# Patient Record
Sex: Female | Born: 1980 | Race: Black or African American | Hispanic: No | Smoking: Never smoker
Health system: Southern US, Community
[De-identification: ages and names within clinical notes are randomized; demographics above are authoritative.]

---

## 2018-01-10 DIAGNOSIS — N970 Female infertility associated with anovulation: Secondary | ICD-10-CM | POA: Diagnosis not present

## 2018-01-31 DIAGNOSIS — Z118 Encounter for screening for other infectious and parasitic diseases: Secondary | ICD-10-CM | POA: Diagnosis not present

## 2018-01-31 DIAGNOSIS — Z114 Encounter for screening for human immunodeficiency virus [HIV]: Secondary | ICD-10-CM | POA: Diagnosis not present

## 2018-01-31 DIAGNOSIS — Z131 Encounter for screening for diabetes mellitus: Secondary | ICD-10-CM | POA: Diagnosis not present

## 2018-01-31 DIAGNOSIS — Z0000 Encounter for general adult medical examination without abnormal findings: Secondary | ICD-10-CM | POA: Diagnosis not present

## 2018-01-31 DIAGNOSIS — Z01419 Encounter for gynecological examination (general) (routine) without abnormal findings: Secondary | ICD-10-CM | POA: Diagnosis not present

## 2018-01-31 DIAGNOSIS — Z1329 Encounter for screening for other suspected endocrine disorder: Secondary | ICD-10-CM | POA: Diagnosis not present

## 2018-01-31 DIAGNOSIS — Z113 Encounter for screening for infections with a predominantly sexual mode of transmission: Secondary | ICD-10-CM | POA: Diagnosis not present

## 2018-01-31 DIAGNOSIS — Z1159 Encounter for screening for other viral diseases: Secondary | ICD-10-CM | POA: Diagnosis not present

## 2018-01-31 DIAGNOSIS — Z130 Encounter for screening for diseases of the blood and blood-forming organs and certain disorders involving the immune mechanism: Secondary | ICD-10-CM | POA: Diagnosis not present

## 2018-01-31 DIAGNOSIS — Z1231 Encounter for screening mammogram for malignant neoplasm of breast: Secondary | ICD-10-CM | POA: Diagnosis not present

## 2018-01-31 DIAGNOSIS — Z6832 Body mass index (BMI) 32.0-32.9, adult: Secondary | ICD-10-CM | POA: Diagnosis not present

## 2018-02-08 DIAGNOSIS — Z13220 Encounter for screening for lipoid disorders: Secondary | ICD-10-CM | POA: Diagnosis not present

## 2018-02-08 DIAGNOSIS — N970 Female infertility associated with anovulation: Secondary | ICD-10-CM | POA: Diagnosis not present

## 2018-03-29 ENCOUNTER — Ambulatory Visit: Payer: Self-pay | Admitting: Registered"

## 2018-08-15 ENCOUNTER — Other Ambulatory Visit: Payer: Self-pay

## 2018-08-15 ENCOUNTER — Encounter: Payer: Self-pay | Admitting: Emergency Medicine

## 2018-08-15 ENCOUNTER — Emergency Department
Admission: EM | Admit: 2018-08-15 | Discharge: 2018-08-15 | Disposition: A | Discharge status: Home / Self Care | Payer: Self-pay | Attending: Student in an Organized Health Care Education/Training Program | Admitting: Student in an Organized Health Care Education/Training Program

## 2018-08-15 DIAGNOSIS — R438 Other disturbances of smell and taste: Secondary | ICD-10-CM | POA: Insufficient documentation

## 2018-08-15 DIAGNOSIS — R2233 Localized swelling, mass and lump, upper limb, bilateral: Secondary | ICD-10-CM | POA: Insufficient documentation

## 2018-08-15 DIAGNOSIS — R110 Nausea: Secondary | ICD-10-CM | POA: Insufficient documentation

## 2018-08-15 LAB — CBC
HCT: 42.7 % (ref 36.0–46.0)
Hemoglobin: 13.8 g/dL (ref 12.0–15.0)
MCH: 27.5 pg (ref 26.0–34.0)
MCHC: 32.3 g/dL (ref 30.0–36.0)
MCV: 85.2 fL (ref 80.0–100.0)
Platelets: 377 10*3/uL (ref 150–400)
RBC: 5.01 MIL/uL (ref 3.87–5.11)
RDW: 12.8 % (ref 11.5–15.5)
WBC: 9.1 10*3/uL (ref 4.0–10.5)
nRBC: 0 % (ref 0.0–0.2)

## 2018-08-15 LAB — COMPREHENSIVE METABOLIC PANEL
ALT: 30 U/L (ref 0–44)
AST: 26 U/L (ref 15–41)
Albumin: 4.8 g/dL (ref 3.5–5.0)
Alkaline Phosphatase: 71 U/L (ref 38–126)
Anion gap: 11 (ref 5–15)
BUN: 7 mg/dL (ref 6–20)
CO2: 27 mmol/L (ref 22–32)
Calcium: 10.4 mg/dL — ABNORMAL HIGH (ref 8.9–10.3)
Chloride: 101 mmol/L (ref 98–111)
Creatinine, Ser: 0.79 mg/dL (ref 0.44–1.00)
GFR calc Af Amer: >60 mL/min (ref >60)
GFR calc non Af Amer: >60 mL/min (ref >60)
Glucose, Bld: 118 mg/dL — ABNORMAL HIGH (ref 70–99)
Potassium: 3.8 mmol/L (ref 3.5–5.1)
Sodium: 139 mmol/L (ref 135–145)
Total Bilirubin: 0.6 mg/dL (ref 0.3–1.2)
Total Protein: 8.4 g/dL — ABNORMAL HIGH (ref 6.5–8.1)

## 2018-08-15 LAB — URINALYSIS, COMPLETE (UACMP) WITH MICROSCOPIC
Bacteria, UA: NONE SEEN
Bilirubin Urine: NEGATIVE
Glucose, UA: NEGATIVE mg/dL
Hgb urine dipstick: NEGATIVE
Ketones, ur: NEGATIVE mg/dL
Leukocytes,Ua: NEGATIVE
Nitrite: NEGATIVE
Protein, ur: NEGATIVE mg/dL
Specific Gravity, Urine: 1.008 (ref 1.005–1.030)
pH: 5 (ref 5.0–8.0)

## 2018-08-15 LAB — LIPASE, BLOOD: Lipase: 27 U/L (ref 11–51)

## 2018-08-15 LAB — POCT PREGNANCY, URINE: Preg Test, Ur: NEGATIVE

## 2018-08-15 MED ORDER — SODIUM CHLORIDE 0.9% FLUSH: 3.0000 mL | Freq: Once | INTRAVENOUS | Status: DC

## 2018-08-15 MED ORDER — PROMETHAZINE HCL 25 MG PO TABS
12.5000 mg | ORAL_TABLET | Freq: Once | ORAL | Status: AC
  Administered 2018-08-15: 12.5 mg via ORAL
  Filled 2018-08-15: qty 1

## 2018-08-15 MED ORDER — PROMETHAZINE HCL 12.5 MG PO TABS: 12.5000 mg | ORAL_TABLET | Freq: Four times a day (QID) | ORAL | 0 refills | Status: AC | PRN

## 2018-08-15 MED ORDER — ONDANSETRON HCL 4 MG PO TABS
4.0000 mg | ORAL_TABLET | Freq: Once | ORAL | Status: AC
  Administered 2018-08-15: 4 mg via ORAL
  Filled 2018-08-15: qty 1

## 2018-08-15 MED ORDER — FAMOTIDINE 20 MG PO TABS: 20.0000 mg | ORAL_TABLET | Freq: Two times a day (BID) | ORAL | 1 refills | Status: AC

## 2018-08-15 NOTE — ED Provider Notes (Signed)
[facility] Emergency Department Provider Note    First MD Initiated Contact with Patient 08/15/18 2157     (approximate)  I have reviewed the triage vital signs and the nursing notes.   HISTORY  Chief Complaint Nausea    HPI Monica Montgomery is a 38 y.o. female presents the ER for several days to weeks of nausea.  Denies any pain.  Denies any diarrhea.  No flank discomfort.  She is also aware that she is had some swelling in her hands and a metallic taste in her mouth.  Does have a history of reflux.  No chest pain or shortness of breath.  No sick contacts.    History reviewed. No pertinent past medical history. No family history on file. History reviewed. No pertinent surgical history. There are no active problems to display for this patient.     Prior to Admission medications   Medication Sig Start Date End Date Taking? Authorizing Provider  famotidine (PEPCID) 20 MG tablet Take 1 tablet (20 mg total) by mouth 2 (two) times daily. 08/15/18 08/15/19  Willy Eddyobinson, Samreen Seltzer, MD  promethazine (PHENERGAN) 12.5 MG tablet Take 1 tablet (12.5 mg total) by mouth every 6 (six) hours as needed for nausea or vomiting. 08/15/18   Willy Eddyobinson, Sevastian Witczak, MD    Allergies Patient has no known allergies.    Social History Social History   Tobacco Use   Smoking status: Never Smoker   Smokeless tobacco: Never Used  Substance Use Topics   Alcohol use: Not Currently    Frequency: Never   Drug use: Never    Review of Systems Patient denies headaches, rhinorrhea, blurry vision, numbness, shortness of breath, chest pain, edema, cough, abdominal pain, nausea, vomiting, diarrhea, dysuria, fevers, rashes or hallucinations unless otherwise stated above in HPI. ____________________________________________   PHYSICAL EXAM:  VITAL SIGNS: Vitals:   08/15/18 1818 08/15/18 2205  BP: 126/81 (!) 149/78  Pulse: 94 80  Resp: 18   Temp: 98.4 F (36.9 C)   SpO2: 100% 98%     Constitutional: Alert and oriented.  Eyes: Conjunctivae are normal.  Head: Atraumatic. Nose: No congestion/rhinnorhea. Mouth/Throat: Mucous membranes are moist.   Neck: No stridor. Painless ROM.  Cardiovascular: Normal rate, regular rhythm. Grossly normal heart sounds.  Good peripheral circulation. Respiratory: Normal respiratory effort.  No retractions. Lungs CTAB. Gastrointestinal: Soft and nontender. No distention. No abdominal bruits. No CVA tenderness. Genitourinary:  Musculoskeletal: No lower extremity tenderness nor edema.  No joint effusions. Neurologic:  Normal speech and language. No gross focal neurologic deficits are appreciated. No facial droop Skin:  Skin is warm, dry and intact. No rash noted. Psychiatric: Mood and affect are normal. Speech and behavior are normal.  ____________________________________________   LABS (all labs ordered are listed, but only abnormal results are displayed)  Results for orders placed or performed during the hospital encounter of 08/15/18 (from the past 24 hour(s))  Urinalysis, Complete w Microscopic     Status: Abnormal   Collection Time: 08/15/18  6:21 PM  Result Value Ref Range   Color, Urine STRAW (A) YELLOW   APPearance HAZY (A) CLEAR   Specific Gravity, Urine 1.008 1.005 - 1.030   pH 5.0 5.0 - 8.0   Glucose, UA NEGATIVE NEGATIVE mg/dL   Hgb urine dipstick NEGATIVE NEGATIVE   Bilirubin Urine NEGATIVE NEGATIVE   Ketones, ur NEGATIVE NEGATIVE mg/dL   Protein, ur NEGATIVE NEGATIVE mg/dL   Nitrite NEGATIVE NEGATIVE   Leukocytes,Ua NEGATIVE NEGATIVE   RBC /  HPF 0-5 0 - 5 RBC/hpf   WBC, UA 0-5 0 - 5 WBC/hpf   Bacteria, UA NONE SEEN NONE SEEN   Squamous Epithelial / LPF 6-10 0 - 5  Lipase, blood     Status: None   Collection Time: 08/15/18  6:22 PM  Result Value Ref Range   Lipase 27 11 - 51 U/L  Comprehensive metabolic panel     Status: Abnormal   Collection Time: 08/15/18  6:22 PM  Result Value Ref Range   Sodium 139 135  - 145 mmol/L   Potassium 3.8 3.5 - 5.1 mmol/L   Chloride 101 98 - 111 mmol/L   CO2 27 22 - 32 mmol/L   Glucose, Bld 118 (H) 70 - 99 mg/dL   BUN 7 6 - 20 mg/dL   Creatinine, Ser 0.79 0.44 - 1.00 mg/dL   Calcium 10.4 (H) 8.9 - 10.3 mg/dL   Total Protein 8.4 (H) 6.5 - 8.1 g/dL   Albumin 4.8 3.5 - 5.0 g/dL   AST 26 15 - 41 U/L   ALT 30 0 - 44 U/L   Alkaline Phosphatase 71 38 - 126 U/L   Total Bilirubin 0.6 0.3 - 1.2 mg/dL   GFR calc non Af Amer >60 >60 mL/min   GFR calc Af Amer >60 >60 mL/min   Anion gap 11 5 - 15  CBC     Status: None   Collection Time: 08/15/18  6:22 PM  Result Value Ref Range   WBC 9.1 4.0 - 10.5 K/uL   RBC 5.01 3.87 - 5.11 MIL/uL   Hemoglobin 13.8 12.0 - 15.0 g/dL   HCT 42.7 36.0 - 46.0 %   MCV 85.2 80.0 - 100.0 fL   MCH 27.5 26.0 - 34.0 pg   MCHC 32.3 30.0 - 36.0 g/dL   RDW 12.8 11.5 - 15.5 %   Platelets 377 150 - 400 K/uL   nRBC 0.0 0.0 - 0.2 %  Pregnancy, urine POC     Status: None   Collection Time: 08/15/18  6:24 PM  Result Value Ref Range   Preg Test, Ur NEGATIVE NEGATIVE   ____________________________________________ ____________________________________________  RADIOLOGY  ____________________________________________   PROCEDURES  Procedure(s) performed:  Procedures    Critical Care performed: no ____________________________________________   INITIAL IMPRESSION / ASSESSMENT AND PLAN / ED COURSE  Pertinent labs & imaging results that were available during my care of the patient were reviewed by me and considered in my medical decision making (see chart for details).   DDX: Gastritis, enteritis, electrolyte abnormality, cholelithiasis, pancreatitis, cholecystitis, esophagitis  Monica Montgomery is a 38 y.o. who presents to the ED with symptoms as described above.  She is afebrile hemodynamically stable.  Her abdominal exam is soft and benign.  Do not see any objective evidence of hand swelling or edema.  Urinalysis without any evidence of  nephritis or nephrotic syndrome.  Primary complaint is nausea.  Will give antiemetic and referral.  Discussed signs and symptoms for which the patient should return to the ER.     The patient was evaluated in Emergency Department today for the symptoms described in the history of present illness. He/she was evaluated in the context of the global COVID-19 pandemic, which necessitated consideration that the patient might be at risk for infection with the SARS-CoV-2 virus that causes COVID-19. Institutional protocols and algorithms that pertain to the evaluation of patients at risk for COVID-19 are in a state of rapid change based on information released by regulatory  bodies including the CDC and federal and state organizations. These policies and algorithms were followed during the patient's care in the ED.  As part of my medical decision making, I reviewed the following data within the electronic MEDICAL RECORD NUMBER Nursing notes reviewed and incorporated, Labs reviewed, notes from prior ED visits and [place] Controlled Substance Database   ____________________________________________   FINAL CLINICAL IMPRESSION(S) / ED DIAGNOSES  Final diagnoses:  Nausea      NEW MEDICATIONS STARTED DURING THIS VISIT:  Discharge Medication List as of 08/15/2018 10:32 PM    START taking these medications   Details  famotidine (PEPCID) 20 MG tablet Take 1 tablet (20 mg total) by mouth 2 (two) times daily., Starting Mon 08/15/2018, Until Tue 08/15/2019, Normal    promethazine (PHENERGAN) 12.5 MG tablet Take 1 tablet (12.5 mg total) by mouth every 6 (six) hours as needed for nausea or vomiting., Starting Mon 08/15/2018, Normal         Note:  This document was prepared using Dragon voice recognition software and may include unintentional dictation errors.    Willy Eddyobinson, Samiha Denapoli, MD 08/15/18 [phone]

## 2018-08-15 NOTE — ED Triage Notes (Signed)
PT c/o nausea xfew days as well as tingling in feet and hands. PT in NAD, denies fever, n/v/d. VSS

## 2018-08-24 ENCOUNTER — Telehealth: Payer: Self-pay

## 2018-08-24 NOTE — Telephone Encounter (Signed)
Pt. Reports she had exposure to COVID 19 at work. Was sent home and told to be tested by her employer. Does not have insurance or a PCP. Will call [facility] Dept. For testing.

## 2018-09-30 ENCOUNTER — Other Ambulatory Visit: Payer: Self-pay | Admitting: Internal Medicine

## 2018-09-30 DIAGNOSIS — Z8639 Personal history of other endocrine, nutritional and metabolic disease: Secondary | ICD-10-CM

## 2018-10-07 ENCOUNTER — Other Ambulatory Visit: Payer: Self-pay

## 2018-10-14 ENCOUNTER — Other Ambulatory Visit: Payer: Self-pay

## 2019-01-31 ENCOUNTER — Ambulatory Visit (HOSPITAL_COMMUNITY)
Admission: EM | Admit: 2019-01-31 | Discharge: 2019-01-31 | Disposition: A | Discharge status: Home / Self Care | Payer: Managed Care, Other (non HMO)

## 2019-01-31 NOTE — ED Notes (Signed)
No answer from patient for registration, assumed LWBS. 

## 2019-02-05 ENCOUNTER — Other Ambulatory Visit: Payer: Self-pay

## 2019-02-05 DIAGNOSIS — R0789 Other chest pain: Secondary | ICD-10-CM | POA: Insufficient documentation

## 2019-02-05 DIAGNOSIS — R05 Cough: Secondary | ICD-10-CM | POA: Insufficient documentation

## 2019-02-05 DIAGNOSIS — Z5321 Procedure and treatment not carried out due to patient leaving prior to being seen by health care provider: Secondary | ICD-10-CM | POA: Diagnosis not present

## 2019-02-06 ENCOUNTER — Emergency Department (HOSPITAL_COMMUNITY): Payer: Managed Care, Other (non HMO)

## 2019-02-06 ENCOUNTER — Emergency Department (HOSPITAL_COMMUNITY)
Admission: EM | Admit: 2019-02-06 | Discharge: 2019-02-06 | Disposition: A | Discharge status: Home / Self Care | Payer: Managed Care, Other (non HMO) | Attending: Emergency Medicine | Admitting: Emergency Medicine

## 2019-02-06 ENCOUNTER — Encounter (HOSPITAL_COMMUNITY): Payer: Self-pay

## 2019-02-06 LAB — BASIC METABOLIC PANEL
Anion gap: 13 (ref 5–15)
BUN: 6 mg/dL (ref 6–20)
CO2: 26 mmol/L (ref 22–32)
Calcium: 10.1 mg/dL (ref 8.9–10.3)
Chloride: 99 mmol/L (ref 98–111)
Creatinine, Ser: 0.82 mg/dL (ref 0.44–1.00)
GFR calc Af Amer: >60 mL/min (ref >60)
GFR calc non Af Amer: >60 mL/min (ref >60)
Glucose, Bld: 124 mg/dL — ABNORMAL HIGH (ref 70–99)
Potassium: 3.9 mmol/L (ref 3.5–5.1)
Sodium: 138 mmol/L (ref 135–145)

## 2019-02-06 LAB — CBC
HCT: 43.7 % (ref 36.0–46.0)
Hemoglobin: 14.2 g/dL (ref 12.0–15.0)
MCH: 28.3 pg (ref 26.0–34.0)
MCHC: 32.5 g/dL (ref 30.0–36.0)
MCV: 87.2 fL (ref 80.0–100.0)
Platelets: 387 10*3/uL (ref 150–400)
RBC: 5.01 MIL/uL (ref 3.87–5.11)
RDW: 12.8 % (ref 11.5–15.5)
WBC: 9.7 10*3/uL (ref 4.0–10.5)
nRBC: 0 % (ref 0.0–0.2)

## 2019-02-06 LAB — TROPONIN I (HIGH SENSITIVITY): Troponin I (High Sensitivity): <2 ng/L (ref <18)

## 2019-02-06 NOTE — ED Notes (Addendum)
Pt left without being seen.called pt.x3

## 2019-02-06 NOTE — ED Triage Notes (Signed)
Pt presents with c/o CP and cough x1 week. Pt states she tested negative for COVID on Thursday. Endorses productive cough and back pain when coughing.

## 2020-05-13 IMAGING — CR DG CHEST 2V
2 series · 2 of 2 positions shown · non-contrast
Comparison: None.

CLINICAL DATA: 38-year-old female with chest pain and cough for 1
week. Recently tested negative for TEDVF-DA.

EXAM:
CHEST - 2 VIEW

[chest pa]
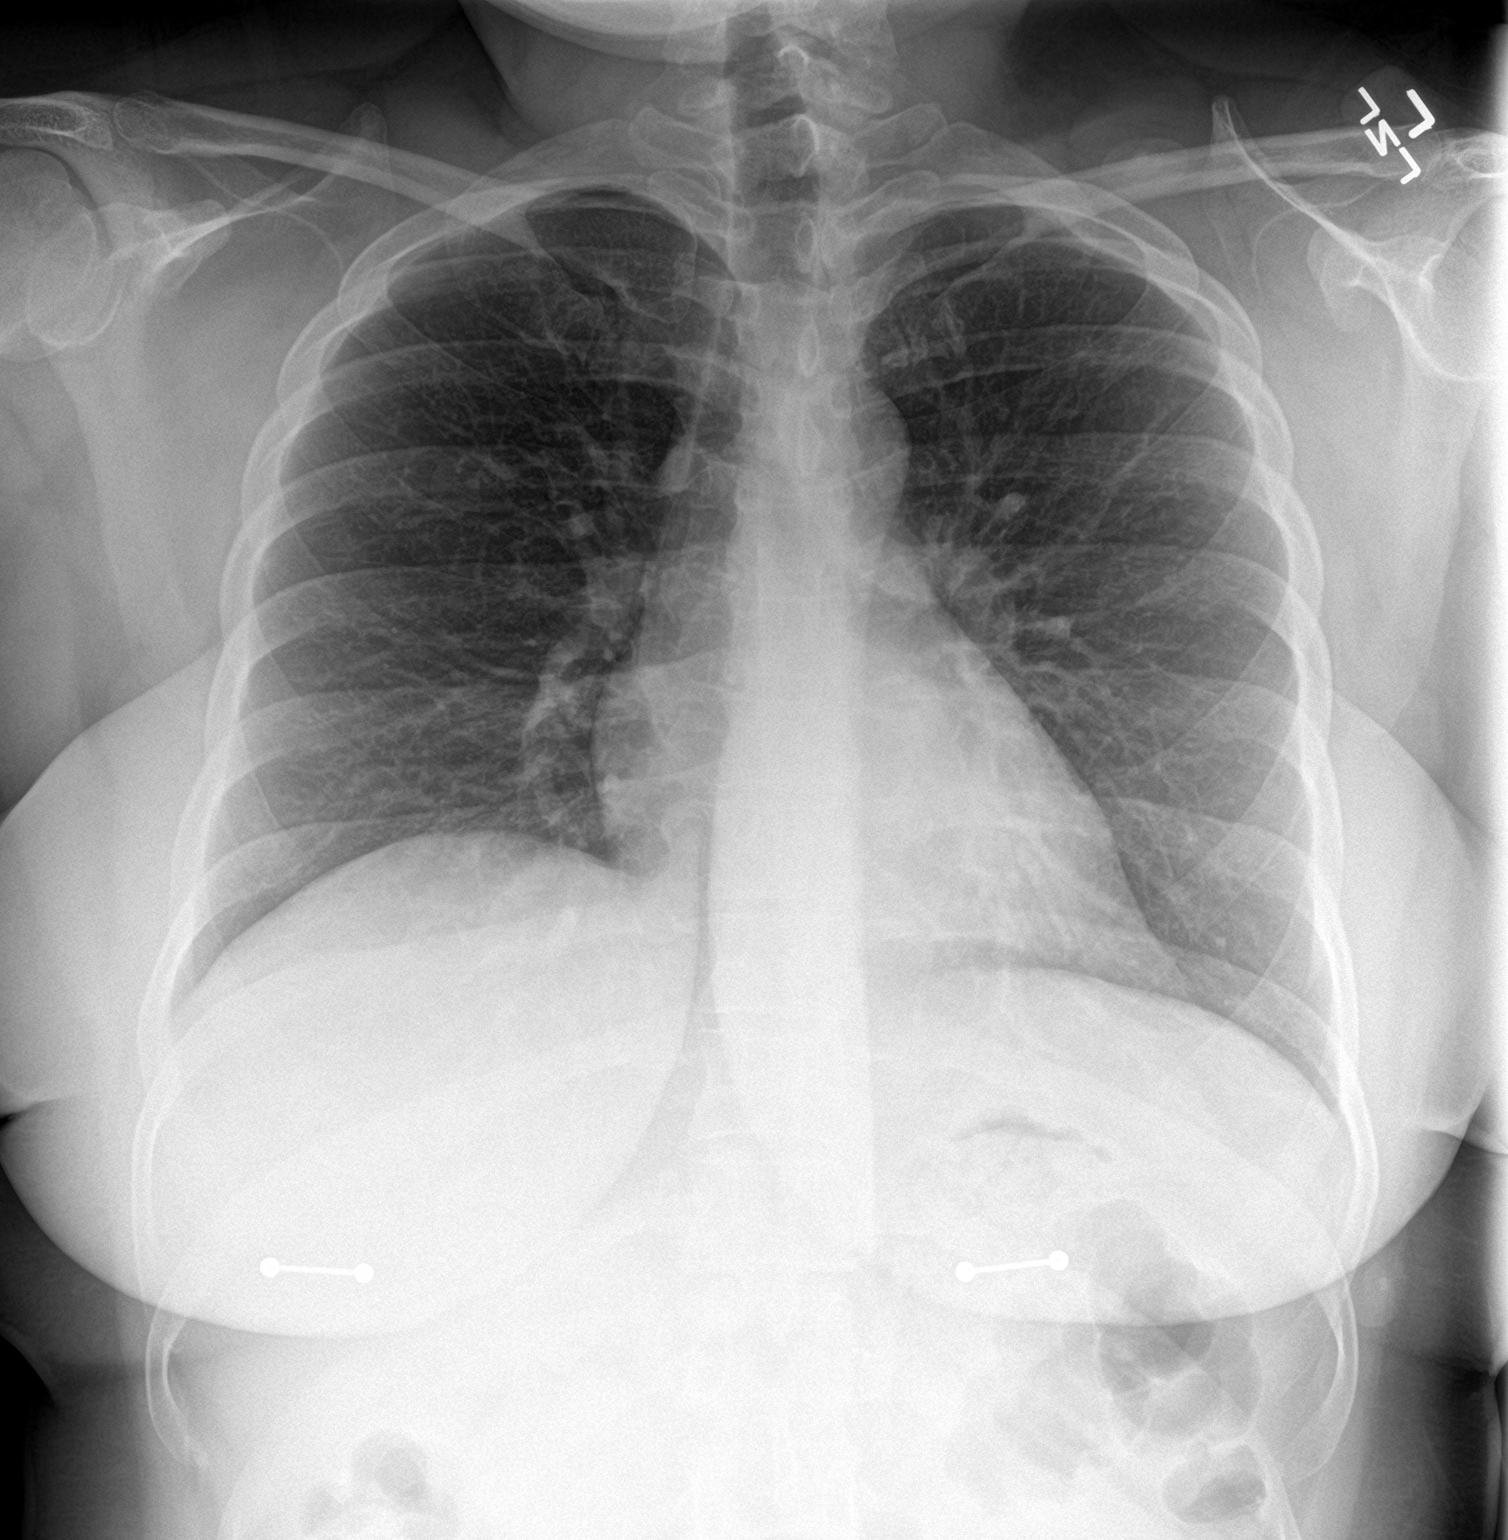

[chest lat]
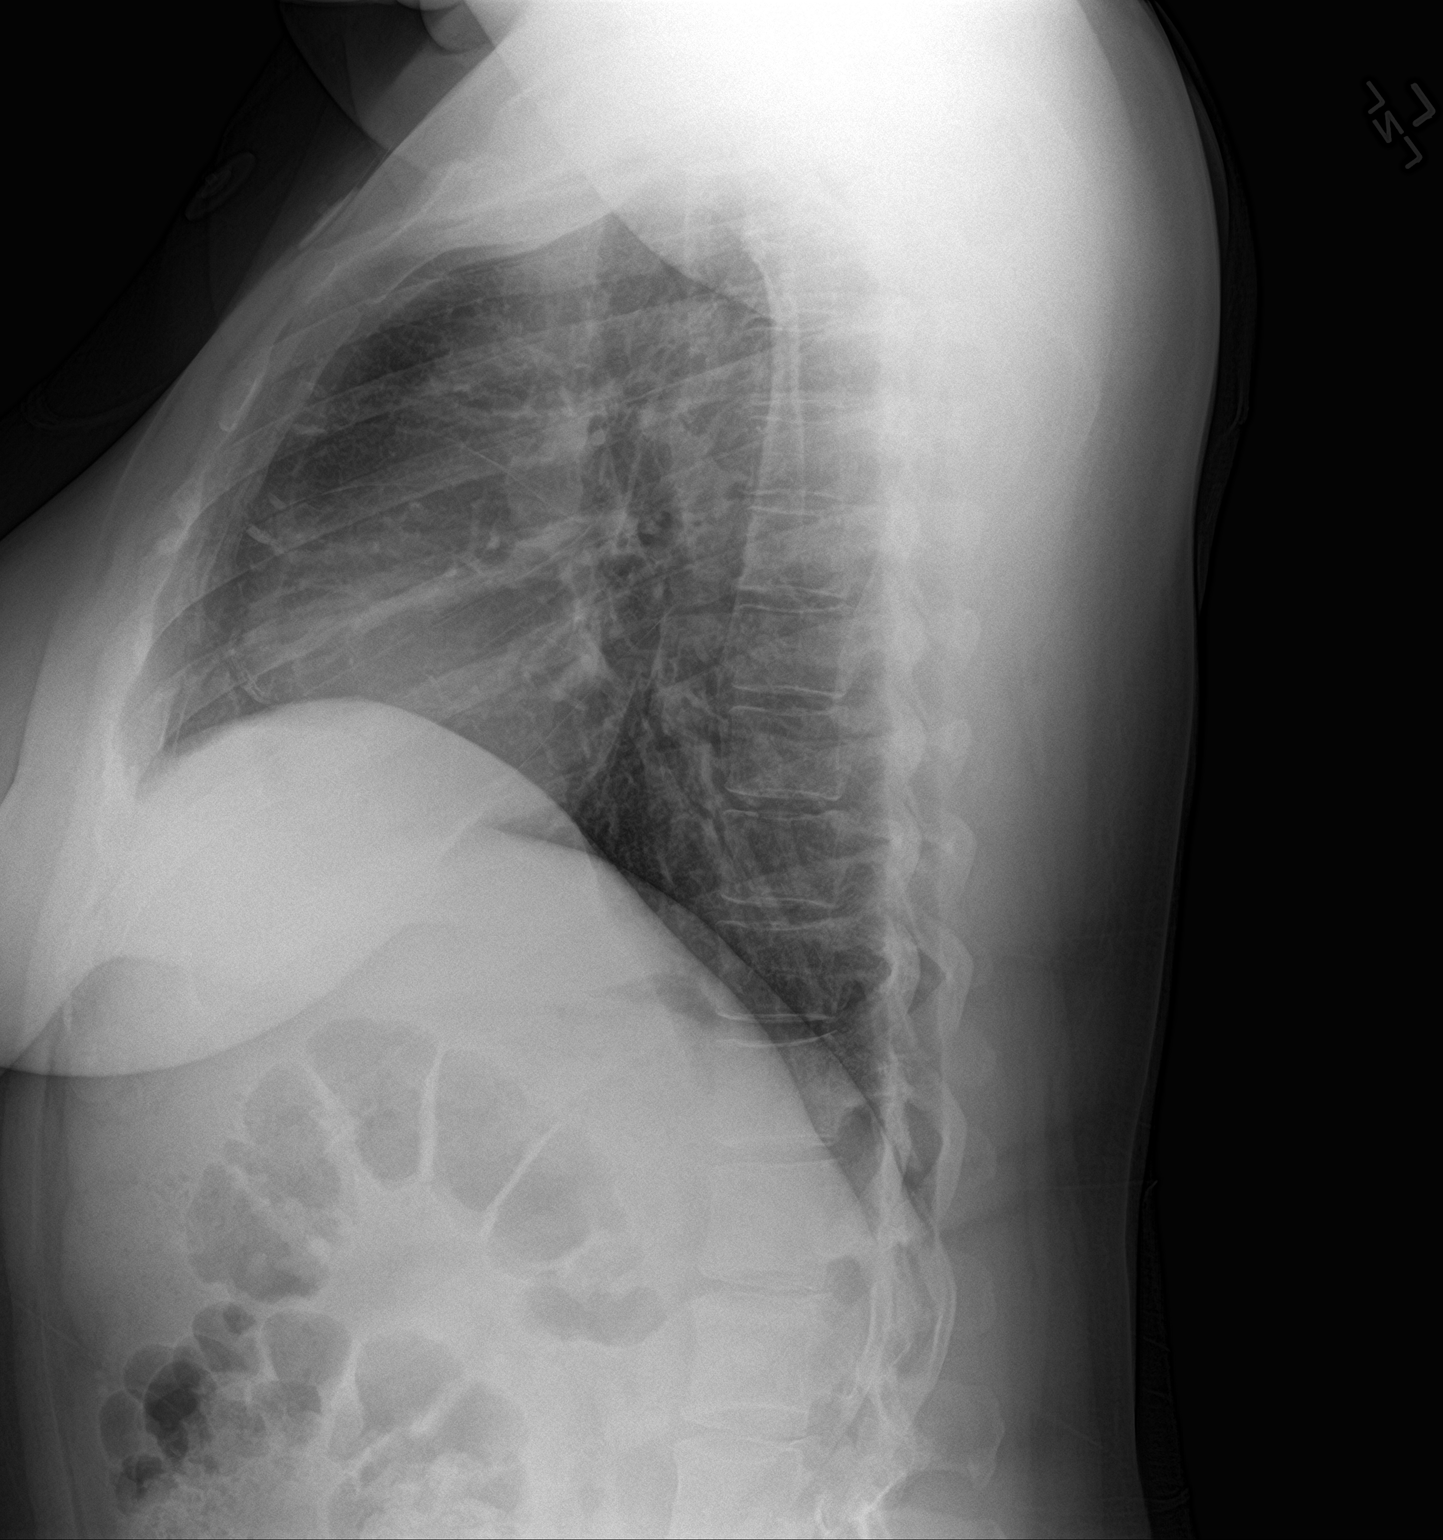

[2 of 2 positions shown; findings below may reference images not displayed]

FINDINGS: Lung volumes and mediastinal contours are within normal limits.
Visualized tracheal air column is within normal limits. Both lungs
appear clear. No pneumothorax or pleural effusion. Negative visible
bowel gas pattern and osseous structures. Incidental nipple
piercings.
IMPRESSION: Negative.  No cardiopulmonary abnormality.
